# Patient Record
Sex: Female | Born: 2000 | Race: White
Health system: Southern US, Community
[De-identification: ages and names within clinical notes are randomized; demographics above are authoritative.]

---
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
## 2018-02-19 ENCOUNTER — Other Ambulatory Visit (INDEPENDENT_AMBULATORY_CARE_PROVIDER_SITE_OTHER): Payer: Self-pay | Admitting: Physician Assistant

## 2018-09-12 ENCOUNTER — Other Ambulatory Visit: Payer: Self-pay

## 2018-09-12 ENCOUNTER — Encounter (HOSPITAL_COMMUNITY): Payer: Self-pay

## 2018-09-12 DIAGNOSIS — J111 Influenza due to unidentified influenza virus with other respiratory manifestations: Secondary | ICD-10-CM | POA: Diagnosis not present

## 2018-09-12 DIAGNOSIS — R509 Fever, unspecified: Secondary | ICD-10-CM | POA: Diagnosis present

## 2018-09-12 NOTE — ED Triage Notes (Signed)
Pt BIB EMS from home. Pt reports cold/flu like symptoms since this morning. Pt reports having fever of 100.1 and took tylenol. Pt reports sore throat and cough.

## 2018-09-13 ENCOUNTER — Emergency Department (HOSPITAL_COMMUNITY)
Admission: EM | Admit: 2018-09-13 | Discharge: 2018-09-13 | Disposition: A | Discharge status: Home / Self Care | Payer: Federal, State, Local not specified - PPO | Attending: Emergency Medicine | Admitting: Emergency Medicine

## 2018-09-13 ENCOUNTER — Emergency Department (HOSPITAL_COMMUNITY): Payer: Federal, State, Local not specified - PPO

## 2018-09-13 DIAGNOSIS — R69 Illness, unspecified: Secondary | ICD-10-CM

## 2018-09-13 DIAGNOSIS — J111 Influenza due to unidentified influenza virus with other respiratory manifestations: Secondary | ICD-10-CM

## 2018-09-13 LAB — URINALYSIS, ROUTINE W REFLEX MICROSCOPIC
BILIRUBIN URINE: NEGATIVE
GLUCOSE, UA: NEGATIVE mg/dL
HGB URINE DIPSTICK: NEGATIVE
Ketones, ur: 80 mg/dL — AB
Leukocytes, UA: NEGATIVE
Nitrite: NEGATIVE
Protein, ur: NEGATIVE mg/dL
Specific Gravity, Urine: 1.032 — ABNORMAL HIGH (ref 1.005–1.030)
pH: 5 (ref 5.0–8.0)

## 2018-09-13 LAB — PREGNANCY, URINE: Preg Test, Ur: NEGATIVE

## 2018-09-13 LAB — GROUP A STREP BY PCR: Group A Strep by PCR: NOT DETECTED

## 2018-09-13 LAB — I-STAT CHEM 8, ED
BUN: 11 mg/dL (ref 6–20)
Calcium, Ion: 1.2 mmol/L (ref 1.15–1.40)
Chloride: 104 mmol/L (ref 98–111)
Creatinine, Ser: 0.7 mg/dL (ref 0.44–1.00)
Glucose, Bld: 108 mg/dL — ABNORMAL HIGH (ref 70–99)
HCT: 34 % — ABNORMAL LOW (ref 36.0–46.0)
HEMOGLOBIN: 11.6 g/dL — AB (ref 12.0–15.0)
Potassium: 3.7 mmol/L (ref 3.5–5.1)
Sodium: 136 mmol/L (ref 135–145)
TCO2: 22 mmol/L (ref 22–32)

## 2018-09-13 MED ORDER — SODIUM CHLORIDE 0.9 % IV BOLUS
1000.0000 mL | Freq: Once | INTRAVENOUS | Status: AC
  Administered 2018-09-13: 1000 mL via INTRAVENOUS

## 2018-09-13 MED ORDER — IBUPROFEN 800 MG PO TABS
800.0000 mg | ORAL_TABLET | Freq: Once | ORAL | Status: AC
  Administered 2018-09-13: 800 mg via ORAL
  Filled 2018-09-13: qty 1

## 2018-09-13 MED ORDER — ONDANSETRON HCL 4 MG/2ML IJ SOLN
4.0000 mg | Freq: Once | INTRAMUSCULAR | Status: AC
  Administered 2018-09-13: 4 mg via INTRAVENOUS
  Filled 2018-09-13: qty 2

## 2018-09-13 MED ORDER — OSELTAMIVIR PHOSPHATE 75 MG PO CAPS: 75.0000 mg | ORAL_CAPSULE | Freq: Two times a day (BID) | ORAL | 0 refills | Status: AC

## 2018-09-13 NOTE — ED Provider Notes (Signed)
Emergency Department Provider Note   I have reviewed the triage vital signs and the nursing notes.   HISTORY  Chief Complaint Flu Like Symptoms   HPI Carol Flynn is a 18 y.o. female who is previously healthy and did not get a flu shot this year the presents the emergency department today with a fever.  Patient states she started with a sore throat earlier today and started having a fever as high as 102 and body aches throughout the day.  Has a bit of nausea but no vomiting.  Mild abdominal pain associate with the nausea.  No urinary symptoms.  Decreased appetite and intake.  Try taking 1 Tylenol prior to arrival which did not seem to help so she came here for further evaluation.  Has had a dry cough but nothing productive.  Last menstrual cycle at the beginning of January. No other associated or modifying symptoms.    History reviewed. No pertinent past medical history.  There are no active problems to display for this patient.   History reviewed. No pertinent surgical history.  Current Outpatient Rx  . Order #: [id number] Class: Historical Med  . Order #: [id number] Class: Print    Allergies Amoxicillin and Penicillins  History reviewed. No pertinent family history.  Social History Social History   Tobacco Use  . Smoking status: Not on file  Substance Use Topics  . Alcohol use: Not on file  . Drug use: Not on file    Review of Systems  All other systems negative except as documented in the HPI. All pertinent positives and negatives as reviewed in the HPI. ____________________________________________   PHYSICAL EXAM:  VITAL SIGNS: ED Triage Vitals  Enc Vitals Group     BP 09/12/18 2215 104/65     Pulse Rate 09/12/18 2215 (!) 130     Resp 09/12/18 2215 16     Temp 09/12/18 2215 99 F (37.2 C)     Temp Source 09/12/18 2215 Oral     SpO2 09/12/18 2215 98 %     Weight 09/12/18 2215 105 lb (47.6 kg)     Height 09/12/18 2215 5\' 7"  (1.702 m)     Constitutional: Alert and oriented. Well appearing and in no acute distress. Eyes: Conjunctivae are normal. PERRL. EOMI. Head: Atraumatic. Nose: No congestion/rhinnorhea. Mouth/Throat: Mucous membranes are moist.  Oropharynx non-erythematous. Neck: No stridor.  No meningeal signs.  Lymphadenopathy Cardiovascular: tacchycardic rate, regular rhythm. Good peripheral circulation. Grossly normal heart sounds.   Respiratory: tachypneic respiratory effort.  No retractions. Lungs CTAB. Gastrointestinal: Soft and nontender. No distention.  Musculoskeletal: No lower extremity tenderness nor edema. No gross deformities of extremities. Neurologic:  Normal speech and language. No gross focal neurologic deficits are appreciated.  Skin:  Skin is warm, dry and intact. No rash noted.   ____________________________________________   LABS (all labs ordered are listed, but only abnormal results are displayed)  Labs Reviewed  URINALYSIS, ROUTINE W REFLEX MICROSCOPIC - Abnormal; Notable for the following components:      Result Value   APPearance HAZY (*)    Specific Gravity, Urine 1.032 (*)    Ketones, ur 80 (*)    All other components within normal limits  I-STAT CHEM 8, ED - Abnormal; Notable for the following components:   Glucose, Bld 108 (*)    Hemoglobin 11.6 (*)    HCT 34.0 (*)    All other components within normal limits  GROUP A STREP BY PCR  PREGNANCY, URINE  POC URINE PREG,  ED   ____________________________________________  EKG   EKG Interpretation  Date/Time:    Ventricular Rate:    PR Interval:    QRS Duration:   QT Interval:    QTC Calculation:   R Axis:     Text Interpretation:         ____________________________________________  RADIOLOGY  Dg Chest 2 View  Result Date: 09/13/2018 CLINICAL DATA:  Cough and fever EXAM: CHEST - 2 VIEW COMPARISON:  None. FINDINGS: The heart size and mediastinal contours are within normal limits. Both lungs are clear. The  visualized skeletal structures are unremarkable. IMPRESSION: No active cardiopulmonary disease. Electronically Signed   By: Deatra RobinsonKevin  Herman M.D.   On: 09/13/2018 03:35    ____________________________________________   INITIAL IMPRESSION / ASSESSMENT AND PLAN / ED COURSE  Strep versus influenza versus other viral infection however she is slightly hypoxic and tachypneic and tachycardic which are probably all related to the fever and the viral infection but has a history of pneumonia so we will get a chest x-ray, labs give her some fluids and anti-paramedics and reevaluate for disposition.  Strep test as well.  Workup unremarkable. Will treat for influenza. Tolerating PO. HR/symptoms improved significantly with defeverscence.   Pertinent labs & imaging results that were available during my care of the patient were reviewed by me and considered in my medical decision making (see chart for details).  ____________________________________________  FINAL CLINICAL IMPRESSION(S) / ED DIAGNOSES  Final diagnoses:  Influenza-like illness     MEDICATIONS GIVEN DURING THIS VISIT:  Medications  ibuprofen (ADVIL,MOTRIN) tablet 800 mg (800 mg Oral Given 09/13/18 0410)  sodium chloride 0.9 % bolus 1,000 mL (0 mLs Intravenous Stopped 09/13/18 0539)  ondansetron (ZOFRAN) injection 4 mg (4 mg Intravenous Given 09/13/18 0411)     NEW OUTPATIENT MEDICATIONS STARTED DURING THIS VISIT:  New Prescriptions   OSELTAMIVIR (TAMIFLU) 75 MG CAPSULE    Take 1 capsule (75 mg total) by mouth every 12 (twelve) hours.    Note:  This note was prepared with assistance of Dragon voice recognition software. Occasional wrong-word or sound-a-like substitutions may have occurred due to the inherent limitations of voice recognition software.   Lorey Pallett, Barbara CowerJason, MD 09/13/18 [id number]

## 2019-03-06 ENCOUNTER — Encounter (INDEPENDENT_AMBULATORY_CARE_PROVIDER_SITE_OTHER): Payer: Self-pay

## 2019-03-27 ENCOUNTER — Encounter (INDEPENDENT_AMBULATORY_CARE_PROVIDER_SITE_OTHER): Payer: Self-pay

## 2019-03-31 ENCOUNTER — Encounter (INDEPENDENT_AMBULATORY_CARE_PROVIDER_SITE_OTHER): Payer: BLUE CROSS/BLUE SHIELD | Admitting: Physician Assistant

## 2019-04-01 LAB — COMPREHENSIVE METABOLIC PANEL
ALT: 13 IU/L (ref 0–32)
AST (SGOT): 16 IU/L (ref 0–40)
Albumin/Globulin Ratio: 1.8 (ref 1.2–2.2)
Albumin: 5 g/dL (ref 3.5–5.5)
Alkaline Phosphatase: 73 IU/L (ref 43–101)
BUN / Creatinine Ratio: 17 (ref 9–23)
BUN: 13 mg/dL (ref 6–20)
Bilirubin, Total: 0.7 mg/dL (ref 0.0–1.2)
CO2: 20 mmol/L (ref 20–29)
Calcium: 10.1 mg/dL (ref 8.7–10.2)
Chloride: 103 mmol/L (ref 96–106)
Creatinine: 0.76 mg/dL (ref 0.57–1.00)
EGFR: 115 mL/min/{1.73_m2} (ref >59)
EGFR: 132 mL/min/{1.73_m2} (ref >59)
Globulin, Total: 2.8 g/dL (ref 1.5–4.5)
Glucose: 90 mg/dL (ref 65–99)
Potassium: 4.4 mmol/L (ref 3.5–5.2)
Protein, Total: 7.8 g/dL (ref 6.0–8.5)
Sodium: 138 mmol/L (ref 134–144)

## 2019-04-01 LAB — CBC AND DIFFERENTIAL
Baso(Absolute): 0 10*3/uL (ref 0.0–0.2)
Basos: 0 %
Eos: 0 %
Eosinophils Absolute: 0 10*3/uL (ref 0.0–0.4)
Hematocrit: 40.7 % (ref 34.0–46.6)
Hemoglobin: 12.8 g/dL (ref 11.1–15.9)
Immature Granulocytes Absolute: 0 10*3/uL (ref 0.0–0.1)
Immature Granulocytes: 0 %
Lymphocytes Absolute: 3 10*3/uL (ref 0.7–3.1)
Lymphocytes: 15 %
MCH: 29.2 pg (ref 26.6–33.0)
MCHC: 31.4 g/dL — ABNORMAL LOW (ref 31.5–35.7)
MCV: 93 fL (ref 79–97)
Monocytes Absolute: 0.6 10*3/uL (ref 0.1–0.9)
Monocytes: 3 %
Neutrophils Absolute: 15.6 10*3/uL — ABNORMAL HIGH (ref 1.4–7.0)
Neutrophils: 82 %
Platelets: 328 10*3/uL (ref 150–450)
RBC: 4.39 x10E6/uL (ref 3.77–5.28)
RDW: 12.5 % (ref 12.3–15.4)
WBC: 19.3 10*3/uL — ABNORMAL HIGH (ref 3.4–10.8)

## 2019-04-01 LAB — LIPID PANEL, WITHOUT TOTAL CHOLESTEROL/HDL RATIO, SERUM
Cholesterol: 179 mg/dL — ABNORMAL HIGH (ref 100–169)
HDL: 75 mg/dL (ref >39)
LDL Calculated: 90 mg/dL (ref 0–109)
Triglycerides: 69 mg/dL (ref 0–89)
VLDL Calculated: 14 mg/dL (ref 5–40)

## 2019-04-01 LAB — TSH: TSH: 1.09 u[IU]/mL (ref 0.450–4.500)

## 2019-04-03 ENCOUNTER — Ambulatory Visit (INDEPENDENT_AMBULATORY_CARE_PROVIDER_SITE_OTHER): Payer: BLUE CROSS/BLUE SHIELD | Admitting: Family

## 2019-04-03 ENCOUNTER — Encounter (INDEPENDENT_AMBULATORY_CARE_PROVIDER_SITE_OTHER): Payer: Self-pay | Admitting: Family

## 2019-04-03 VITALS — BP 100/62 | HR 84 | Temp 99.3°F | Resp 16 | Ht 67.0 in | Wt 101.4 lb

## 2019-04-03 DIAGNOSIS — Z0000 Encounter for general adult medical examination without abnormal findings: Secondary | ICD-10-CM

## 2019-04-03 DIAGNOSIS — Z23 Encounter for immunization: Secondary | ICD-10-CM

## 2019-04-03 DIAGNOSIS — Z3041 Encounter for surveillance of contraceptive pills: Secondary | ICD-10-CM

## 2019-04-03 MED ORDER — LEVONORGEST-ETH ESTRAD 91-DAY 0.15-0.03 &0.01 MG PO TABS
1.0000 | ORAL_TABLET | Freq: Every day | ORAL | 3 refills | Status: DC
Start: 2019-04-03 — End: 2020-03-14

## 2019-04-03 NOTE — Progress Notes (Signed)
[facility] [place] [facility]                       Date of Exam: 04/03/2019 4:14 PM        Patient ID: Virginia Navarro is a 18 y.o. female.  Attending Physician: Levy Sjogren, NP        Chief Complaint:    Chief Complaint   Patient presents with    Annual Exam     not fasting               HPI:    Pt still gets sick to stomach when she has a menstrual cycle but less so than before starting birth control. Happens about every 3rd month.  Thinks she has an ingrowing toenail Lt Great toe. Painful when wearing narrow shoes.       Visit Type: Health Maintenance Visit    Reported Health: good health  Reported Diet: eats healthy but could do better.  Reported Exercise: 3-4x/week, <15 minutes and walking    Dental: regular dental visits twice a year  Vision: contact lenses  Hearing: normal hearing    Immunization Status: Needs Bexsero #2    Reproductive Health: not currently sexually active  Contraception: oral contraceptives.    PHQ 2: 0    Safety Elements Used: uses seat belts, smoke detectors in household, carbon monoxide detectors in household, sunscreen use,   and no guns at home. Pt does not drive .            Problem List:    There is no problem list on file for this patient.            Current Meds:    Outpatient Medications Marked as Taking for the 04/03/19 encounter (Office Visit) with Levy Sjogren, NP   Medication Sig Dispense Refill    Sodium Fluoride (Sodium Fluoride 5000 PPM) 1.1 % Paste sodium fluoride 1.1 % dental paste      [DISCONTINUED] Norgestim-Eth Estrad Triphasic 0.18/0.215/0.25 MG-25 MCG Tab Tri-Lo-Sprintec 0.18 mg/0.215 mg/0.25 mg-25 mcg tablet            Allergies:    Allergies   Allergen Reactions    Amoxicillin Hives    Penicillins Hives             Past Surgical History:    History reviewed. No pertinent surgical history.        Family History:    History reviewed. No pertinent family history.        Social History:    Social History     Tobacco Use     Smoking status: Never Smoker    Smokeless tobacco: Never Used   Substance Use Topics    Alcohol use: Never     Frequency: Never    Drug use: Never          The following sections were reviewed this encounter by the provider:   Tobacco   Allergies   Meds   Problems   Med Hx   Surg Hx   Fam Hx              Vital Signs:    BP 100/62 (BP Site: Right arm, Patient Position: Sitting, Cuff Size: Large)    Pulse 84    Temp 99.3 F (37.4 C) (Tympanic)    Resp 16    Ht 1.702 m (5\' 7" )    Wt 46 kg (101 lb 6.4 oz)  LMP 03/11/2019 (Exact Date)    BMI 15.88 kg/m          ROS:    Review of Systems   Constitutional: Negative for fatigue and unexpected weight change.   HENT: Negative for congestion and sinus pain.    Eyes: Negative for itching and visual disturbance.   Respiratory: Negative for cough and shortness of breath.    Cardiovascular: Negative for chest pain and palpitations.   Gastrointestinal: Negative for constipation, diarrhea, nausea and vomiting.   Endocrine: Negative for cold intolerance and heat intolerance.   Genitourinary: Negative for difficulty urinating and menstrual problem.   Musculoskeletal: Negative for arthralgias and myalgias.   Skin: Negative for color change and rash.   Neurological: Negative for light-headedness and headaches.   Hematological: Does not bruise/bleed easily.   Psychiatric/Behavioral: Negative for sleep disturbance. The patient is not nervous/anxious.               Physical Exam:    Physical Exam  Constitutional:       General: She is not in acute distress.     Appearance: Normal appearance.   HENT:      Head: Normocephalic and atraumatic.      Right Ear: Tympanic membrane, ear canal and external ear normal.      Left Ear: Tympanic membrane, ear canal and external ear normal.      Nose: Nose normal.      Mouth/Throat:      Mouth: Mucous membranes are moist.      Pharynx: Oropharynx is clear.   Eyes:      Extraocular Movements: Extraocular movements intact.      Conjunctiva/sclera:  Conjunctivae normal.      Pupils: Pupils are equal, round, and reactive to light.   Neck:      Musculoskeletal: Normal range of motion and neck supple.   Cardiovascular:      Rate and Rhythm: Normal rate and regular rhythm.      Heart sounds: No murmur. No friction rub. No gallop.    Pulmonary:      Effort: Pulmonary effort is normal. No respiratory distress.      Breath sounds: Normal breath sounds. No wheezing.   Abdominal:      General: Abdomen is flat. Bowel sounds are normal.      Palpations: Abdomen is soft. There is no mass.      Tenderness: There is no abdominal tenderness. There is no guarding or rebound.   Musculoskeletal: Normal range of motion.      Right lower leg: No edema.      Left lower leg: No edema.   Lymphadenopathy:      Cervical: No cervical adenopathy.      Upper Body:      Right upper body: No supraclavicular adenopathy.      Left upper body: No supraclavicular adenopathy.   Skin:     General: Skin is warm and dry.      Comments: Lt Great toe slightly tender to touch along medial nailbed. No erythema or edema.   Neurological:      General: No focal deficit present.      Mental Status: She is alert and oriented to person, place, and time.      Cranial Nerves: No cranial nerve deficit.   Psychiatric:         Mood and Affect: Mood normal.         Behavior: Behavior normal.  Assessment:    1. Well adult exam  - Meningococcal group B recomb 2 DOSE    2. Immunization due  - Meningococcal group B recomb 2 DOSE    3. Encounter for surveillance of contraceptive pills  - levonorgestrel-ethinyl estradiol (SEASONIQUE) 0.15-0.03 &0.01 MG per tablet; Take 1 tablet by mouth daily  Dispense: 84 tablet; Refill: 3            Plan:    Health Maintenance    Lifestyle discussed: Healthy diet rich in fruits and vegetables, whole grains and lean meats, scarce in sugars, carbs, processed foods.   Exercise 30 minutes at least five days a week. (total of 150 minutes per week).    Wear Sunscreen in the sun,  helmets on every bike ride and seat belts for every car ride.   Changing OCP to seasonal to lessen symptoms.  Gave list of Podiatrists to see if Great toe pain worsens.        Follow-up:    Return in about 1 year (around 04/02/2020) for wellness.         Levy Sjogren, NP

## 2019-04-21 ENCOUNTER — Encounter (INDEPENDENT_AMBULATORY_CARE_PROVIDER_SITE_OTHER): Payer: Self-pay

## 2019-10-29 ENCOUNTER — Encounter (INDEPENDENT_AMBULATORY_CARE_PROVIDER_SITE_OTHER): Payer: Self-pay

## 2019-11-01 ENCOUNTER — Ambulatory Visit (INDEPENDENT_AMBULATORY_CARE_PROVIDER_SITE_OTHER): Payer: BLUE CROSS/BLUE SHIELD

## 2019-11-01 DIAGNOSIS — Z23 Encounter for immunization: Secondary | ICD-10-CM

## 2020-03-10 ENCOUNTER — Emergency Department
Admission: EM | Admit: 2020-03-10 | Discharge: 2020-03-10 | Disposition: A | Discharge status: Home / Self Care | Payer: BLUE CROSS/BLUE SHIELD | Attending: Pediatrics | Admitting: Pediatrics

## 2020-03-10 ENCOUNTER — Emergency Department: Payer: BLUE CROSS/BLUE SHIELD

## 2020-03-10 DIAGNOSIS — R42 Dizziness and giddiness: Secondary | ICD-10-CM | POA: Insufficient documentation

## 2020-03-10 DIAGNOSIS — W273XXA Contact with needle (sewing), initial encounter: Secondary | ICD-10-CM

## 2020-03-10 DIAGNOSIS — W228XXA Striking against or struck by other objects, initial encounter: Secondary | ICD-10-CM | POA: Insufficient documentation

## 2020-03-10 DIAGNOSIS — Y9389 Activity, other specified: Secondary | ICD-10-CM | POA: Insufficient documentation

## 2020-03-10 DIAGNOSIS — W458XXA Other foreign body or object entering through skin, initial encounter: Secondary | ICD-10-CM | POA: Insufficient documentation

## 2020-03-10 DIAGNOSIS — S61042A Puncture wound with foreign body of left thumb without damage to nail, initial encounter: Secondary | ICD-10-CM | POA: Insufficient documentation

## 2020-03-10 MED ORDER — ACETAMINOPHEN 325 MG PO TABS
650.0000 mg | ORAL_TABLET | Freq: Once | ORAL | Status: AC
Start: 2020-03-10 — End: 2020-03-10
  Administered 2020-03-10: 18:00:00 650 mg via ORAL
  Filled 2020-03-10: qty 2

## 2020-03-10 NOTE — Discharge Instructions (Signed)
Wound Infection    A small number of wounds can get infected. This can happen even if they are treated and cleaned well. Infection symptoms are increasing pain, pus, redness or warmth. Drainage from the wound site is another symptom. The wound edges may also come apart. Wound infections are treated with antibiotics. Treatment also includes cleaning.    YOU SHOULD SEEK MEDICAL ATTENTION IMMEDIATELY, EITHER HERE OR AT THE NEAREST EMERGENCY DEPARTMENT, IF ANY OF THE FOLLOWING OCCURS:   Symptoms keep getting worse despite treatment.   The infection size increases and redness spreads.   You develop fevers (temperature higher than 100.85F / 38C).   You feel weak or lightheaded.   Red streaks spread from the infection site.   Any other new symptoms or concerns.

## 2020-03-10 NOTE — ED Notes (Signed)
Pt awake and alert. No bleeding or drainage from thumb. NAD

## 2020-03-10 NOTE — ED Notes (Signed)
Bed: OZ18-P  Expected date:   Expected time:   Means of arrival:   Comments:  Medic 430

## 2020-03-10 NOTE — ED Provider Notes (Signed)
[facility] PEDIATRIC EMERGENCY DEPARTMENT H&P                                             ATTENDING SUPERVISORY NOTE      Visit date: 03/10/2020      CLINICAL SUMMARY          Diagnosis:    .     Final diagnoses:   Contact with sewing needle, initial encounter         MDM Notes:    19 y.o. female with sewing needle embedded in dorsum of left thumb.  Removed  No active bleeding  Xray neg for fracture.  rec localized wound care, bacitracin and bandaid for home.               Disposition:         Discharge         Discharge Prescriptions     None                      CLINICAL INFORMATION        HPI:        Chief Complaint: Finger Injury  .    Virginia Navarro is a 19 y.o. female who presents with finger injury. She was sewing when she accidentally got the needle stuck in the middle of the nail of her L thumb. She jerked her hand back and the needle broke off in her thumb through the nail. She felt a little faint so she laid down. No LOC. Came to the ED for further evaluation.     History obtained from: patient      ROS:      Positive and negative ROS elements as per HPI.  All other systems reviewed and negative.      Physical Exam:      Pulse 88   BP 111/66   Resp 20   SpO2 99 %   Temp 97.9 F (36.6 C)   Wt 47 kg    Alert, anxious adult female  Right thumb with 1 cm sewing needle embedded in the nail and extending vertically out of the finger.  No other injuries noted.            PAST HISTORY        Primary Care Provider: Jimmy Picket, PA        PMH/PSH:    .     History reviewed. No pertinent past medical history.    She has no past surgical history on file.      Social/Family History:      Pediatric History   Patient Parents    Wisnieski, scott (Father)     Other Topics Concern    Not on file   Social History Narrative    Not on file     Social History     Tobacco Use    Smoking status: Never Smoker    Smokeless tobacco: Never Used   Substance Use Topics    Alcohol use: Never      Additional Social History: Lives with parents    History reviewed. No pertinent family history.      Listed Medications on Arrival:    .     Home Medications             levonorgestrel-ethinyl estradiol (SEASONIQUE) 0.15-0.03 &  0.01 MG per tablet     Take 1 tablet by mouth daily     Sodium Fluoride (Sodium Fluoride 5000 PPM) 1.1 % Paste     sodium fluoride 1.1 % dental paste          Allergies: She is allergic to amoxicillin and penicillins.            VISIT INFORMATION        Clinical Course in the ED:                   Medications Given in the ED:    .     ED Medication Orders (From admission, onward)    Start Ordered     Status Ordering Provider    03/10/20 1747 03/10/20 1746  acetaminophen (TYLENOL) tablet 650 mg  Once     Route: Oral  Ordered Dose: 650 mg     Last MAR action: Given Reathel Turi            Procedures:      Procedures      Interpretations:      Radiology -             interpreted by me with the following observations: no fracture, no residual FB.                 RESULTS        Lab Results:      Results     ** No results found for the last 24 hours. **              Radiology Results:      Finger(s) Left Minimum 2 View   Final Result       No fracture or other metallic foreign body identified.      Prince Solian, MD    03/10/2020 6:45 PM                  Supervisory Statements:      I have reviewed and agree with the history except as noted above. The pertinent physical exam has been documented.  I have reviewed and agree with the final ED diagnosis.      Scribe Attestation:      I was acting as a Neurosurgeon for Mechele Collin, MD on [place],[address]    I am the first provider for this patient and I personally performed the services documented. Cristal [address] is scribing for me on [place],Kindra ELIZABETH. This note accurately reflects work and decisions made by me.  Mechele Collin, MD                                    Mechele Collin, MD  03/12/20  1304

## 2020-03-10 NOTE — ED Provider Notes (Signed)
[facility] PEDIATRIC EMERGENCY DEPARTMENT RESIDENT H&P       CLINICAL INFORMATION        HPI:        Chief Complaint: Finger Injury  .    Virginia Navarro is a 19 y.o. female who presents with sewing needle stuck in left thumb. Reports she was using sewing machine and needle penetrated left thumb through the middle of the nail. Patient jerked hand back and needle broke off. Reports that she initially felt lightheaded and had to lie down. Reports pain is 9 or 10 out of 10 over area. Denies syncope or LOC. Immunizations UTD.     History obtained from: patient, parent      ROS:      Review of Systems   Constitutional: Negative for chills and fever.   Gastrointestinal: Negative for nausea and vomiting.   Neurological: Positive for light-headedness. Negative for syncope.         Physical Exam:      Pulse 88   BP 111/66   Resp 20   SpO2 99 %   Temp 97.9 F (36.6 C)   Wt 47 kg    Physical Exam  Constitutional:       Appearance: Normal appearance.   Skin:     General: Skin is warm and dry.      Capillary Refill: Capillary refill takes less than 2 seconds.      Comments: 0.5cm needle in left thumb penetrating nail; left thumb neurovascularly intact   Neurological:      Mental Status: She is alert.                 PAST HISTORY        Primary Care Provider: Jimmy Picket, PA        PMH/PSH:    .     History reviewed. No pertinent past medical history.    She has no past surgical history on file.      Social/Family History:      Pediatric History   Patient Parents    Yepez, scott (Father)     Other Topics Concern    Not on file   Social History Narrative    Not on file     Social History     Tobacco Use    Smoking status: Never Smoker    Smokeless tobacco: Never Used   Substance Use Topics    Alcohol use: Never     Additional Social History: Lives with parents    History reviewed. No pertinent family history.      Listed Medications on Arrival:    .     Home Medications              levonorgestrel-ethinyl estradiol (SEASONIQUE) 0.15-0.03 &0.01 MG per tablet     Take 1 tablet by mouth daily     Sodium Fluoride (Sodium Fluoride 5000 PPM) 1.1 % Paste     sodium fluoride 1.1 % dental paste         Allergies: She is allergic to amoxicillin and penicillins.            VISIT INFORMATION        Reassessments/Clinical Course:    Sewing needle removed from left thumb. Xray finger shows no evidence of metallic foreign body or fractures.         Conversations with Other Providers:              Medications Given  in the ED:    .     ED Medication Orders (From admission, onward)    Start Ordered     Status Ordering Provider    03/10/20 1747 03/10/20 1746  acetaminophen (TYLENOL) tablet 650 mg  Once     Route: Oral  Ordered Dose: 650 mg     Last MAR action: Given Mechele Collin            Procedures:      Foreign Body    Date/Time: 03/10/2020 8:07 PM  Performed by: Mechele Collin, MD  Authorized by: Mechele Collin, MD   Intake: nail of left thumb.    Sedation:  Patient sedated: no    Patient cooperative: yes  Complexity: simple  Objects recovered: tip of sewming needle about 0.5cm in length  Post-procedure assessment: foreign body removed  Patient tolerance: patient tolerated the procedure well with no immediate complications  Comments: Bacitracin applied over area          Assessment/Plan:    [name] is a 19 y.o. female who presents with sewing needle stuck in left thumb. Needle removed and xray finger shows no evidence of metallic foreign body or fractures. Discharged home with instructions to watch for  infection and return to ED if infection noted.             Carlyon Shadow, MD  Resident  03/10/20 2010       Carlyon Shadow, MD  Resident  03/10/20 2012

## 2020-03-10 NOTE — ED Triage Notes (Signed)
Pt was sewing and got needle stick in nail of left thumb. No bleeding. Thought that she was going to pass out so layed down. No actually syncope. Awake and alert. Doesn't want to look at it.

## 2020-03-14 ENCOUNTER — Encounter (INDEPENDENT_AMBULATORY_CARE_PROVIDER_SITE_OTHER): Payer: Self-pay

## 2020-03-14 ENCOUNTER — Ambulatory Visit (INDEPENDENT_AMBULATORY_CARE_PROVIDER_SITE_OTHER): Payer: BLUE CROSS/BLUE SHIELD | Admitting: Family

## 2020-03-14 ENCOUNTER — Encounter (INDEPENDENT_AMBULATORY_CARE_PROVIDER_SITE_OTHER): Payer: Self-pay | Admitting: Family

## 2020-03-14 VITALS — BP 120/80 | HR 80 | Temp 98.7°F | Resp 18 | Ht 67.0 in | Wt 104.3 lb

## 2020-03-14 DIAGNOSIS — N911 Secondary amenorrhea: Secondary | ICD-10-CM

## 2020-03-14 DIAGNOSIS — S61349D Puncture wound with foreign body of unspecified finger with damage to nail, subsequent encounter: Secondary | ICD-10-CM

## 2020-03-14 LAB — POCT HCG, URINE, BY VISUAL COLOR

## 2020-03-14 NOTE — Progress Notes (Signed)
Subjective:      Patient ID: Virginia Navarro is a 19 y.o. female     Chief Complaint   Patient presents with    Amenorrhea     x 1week        Pt reports missing period x 1week.  Pt wants to make sure she is not pregnant.  Pt also went to ER on 03/10/20 also stabbed herself with a needle on L thumb.  She would like the provider to check how it is healing. Sewing machine needle went through her nail but did not penetrate bone.    LMP: 01/30/2020  Switched from [place] to [place] 1/6mcg 2 months ago. Has only missed one pill.  No nausea, ab pain, fatigue.    Amenorrhea  The patient's primary symptoms include missed menses. Pertinent negatives include no abdominal pain, anorexia, back pain, chills, constipation, diarrhea, discolored urine, dysuria, fever, flank pain, frequency, headaches, hematuria, joint pain, joint swelling, nausea, painful intercourse, rash, sore throat, urgency or vomiting.        The following sections were reviewed this encounter by the provider:        Review of Systems   Constitutional: Negative for chills and fever.   HENT: Negative for sore throat.    Gastrointestinal: Negative for abdominal pain, anorexia, constipation, diarrhea, nausea and vomiting.   Genitourinary: Positive for missed menses. Negative for dysuria, flank pain, frequency, hematuria and urgency.   Musculoskeletal: Negative for back pain and joint pain.   Skin: Negative for rash.   Neurological: Negative for headaches.          BP 120/80 (BP Site: Right arm, Patient Position: Sitting, Cuff Size: Medium)    Pulse 80    Temp 98.7 F (37.1 C) (Tympanic)    Resp 18    Ht 1.702 m (5\' 7" )    Wt 47.3 kg (104 lb 4.4 oz)    BMI 16.33 kg/m     Objective:     Physical Exam  Constitutional:       Appearance: Normal appearance. She is underweight.   Pulmonary:      Effort: Pulmonary effort is normal.   Skin:     General: Skin is warm and dry.      Comments: Small puncture in Left thumb nail. No surrounding erythema. No  discharge.   Neurological:      General: No focal deficit present.      Mental Status: She is alert and oriented to person, place, and time.   Psychiatric:         Mood and Affect: Mood normal.         Behavior: Behavior normal.          Assessment:     1. Secondary amenorrhea  - POCT hCG, urine, by visual color    2. Puncture wound of finger with foreign body with damage to nail, subsequent encounter        Plan:     Urine pregnancy test negative.  Likely due to adjusting to new OCP.  Keep nail clean with soap and water.  Follow up if any discharge or redness.    Levy Sjogren, NP

## 2020-09-13 IMAGING — CR DG CHEST 2V
2 series · 2 of 2 positions shown · non-contrast
Comparison: None.

CLINICAL DATA: Cough and fever

EXAM:
CHEST - 2 VIEW

[w chest pa]
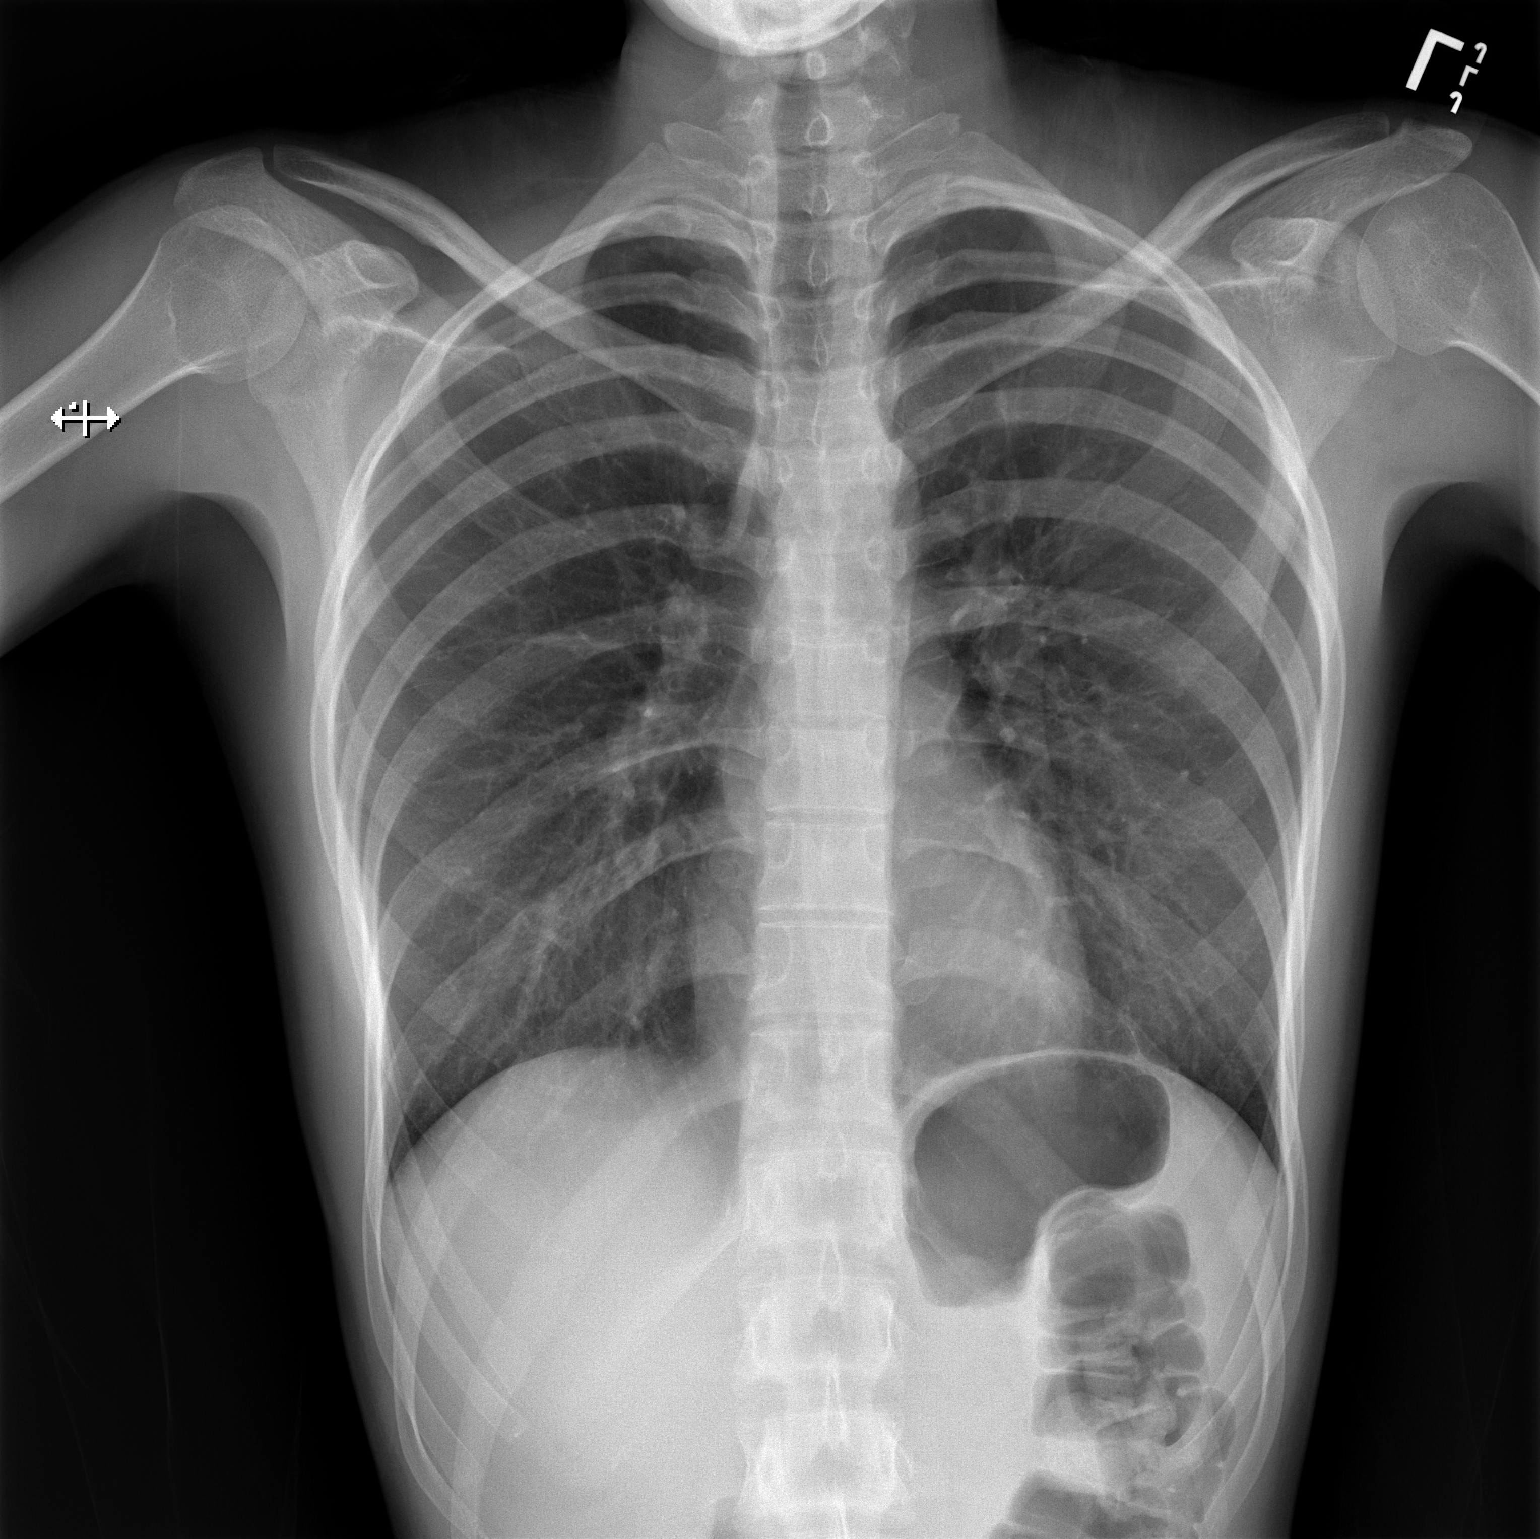

[w chest lat]
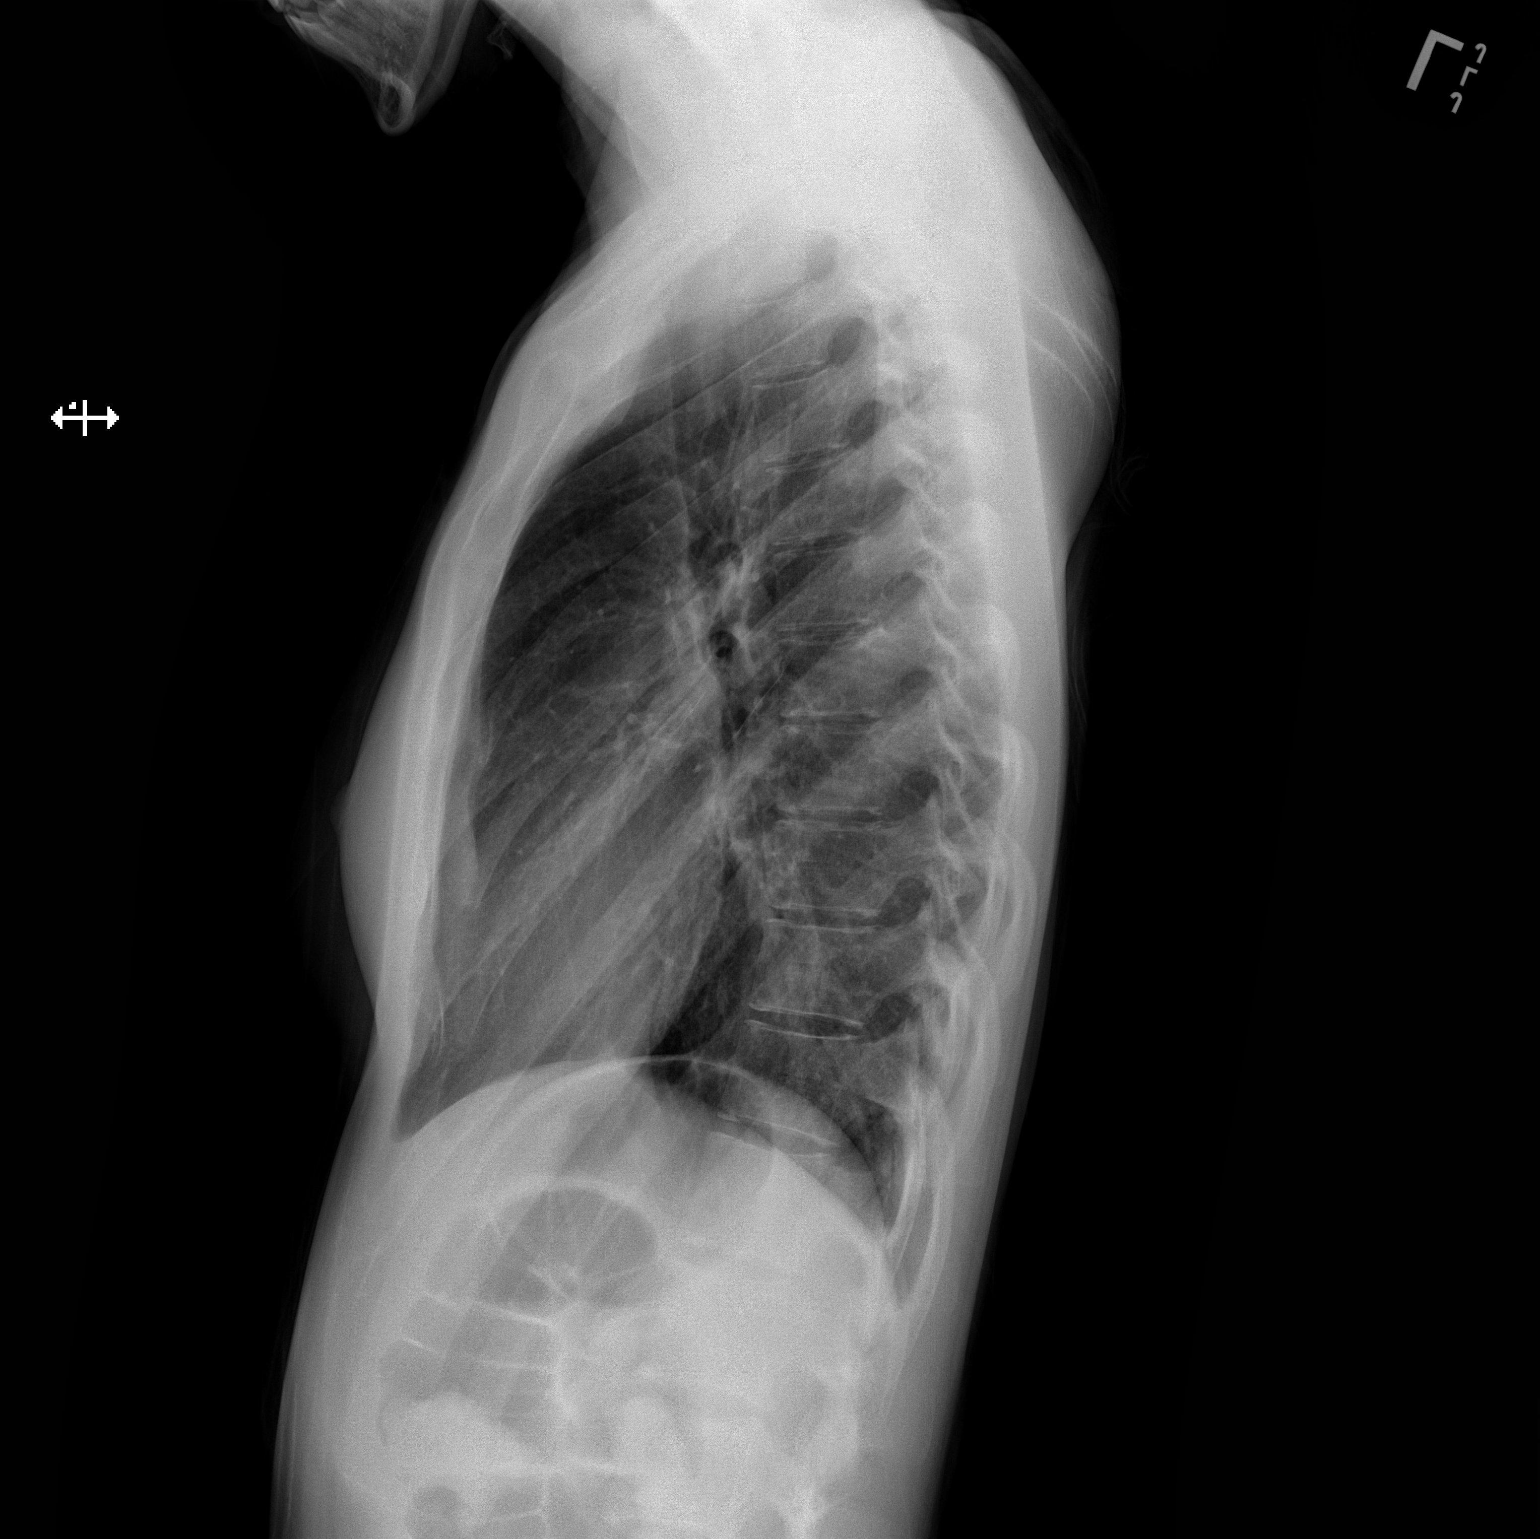

[2 of 2 positions shown; findings below may reference images not displayed]

FINDINGS: The heart size and mediastinal contours are within normal limits.
Both lungs are clear. The visualized skeletal structures are
unremarkable.
IMPRESSION: No active cardiopulmonary disease.

## 2021-01-16 ENCOUNTER — Telehealth (INDEPENDENT_AMBULATORY_CARE_PROVIDER_SITE_OTHER): Payer: Self-pay | Admitting: Family

## 2021-01-16 NOTE — Telephone Encounter (Signed)
Pt is sched for tetanus shot 01/19/21. Pls have orders ready.

## 2021-01-17 NOTE — Telephone Encounter (Signed)
Pt's last tdap 11/28/10. Appropriately scheduled. Orders will be placed at time of visit.

## 2021-01-19 ENCOUNTER — Other Ambulatory Visit: Payer: Self-pay

## 2021-01-19 ENCOUNTER — Ambulatory Visit (INDEPENDENT_AMBULATORY_CARE_PROVIDER_SITE_OTHER): Payer: BLUE CROSS/BLUE SHIELD

## 2021-01-19 DIAGNOSIS — Z23 Encounter for immunization: Secondary | ICD-10-CM

## 2021-01-19 NOTE — Progress Notes (Signed)
Pt needs Tdap for school requirement.  VIS given and reviewed, all questions answered to pt satisfaction.  Pt tolerated injection without immediate side effect and waited x 10 minutes to ensure well being.  Vaccine summary given for pt to provide to school.

## 2021-03-10 ENCOUNTER — Encounter (INDEPENDENT_AMBULATORY_CARE_PROVIDER_SITE_OTHER): Payer: Self-pay

## 2021-04-27 ENCOUNTER — Other Ambulatory Visit (INDEPENDENT_AMBULATORY_CARE_PROVIDER_SITE_OTHER): Payer: Self-pay | Admitting: Family Medicine

## 2021-05-25 HISTORY — PX: DENTAL SURGERY: SHX609

## 2021-06-01 ENCOUNTER — Ambulatory Visit (INDEPENDENT_AMBULATORY_CARE_PROVIDER_SITE_OTHER): Payer: BLUE CROSS/BLUE SHIELD | Admitting: Family

## 2021-06-01 ENCOUNTER — Encounter (INDEPENDENT_AMBULATORY_CARE_PROVIDER_SITE_OTHER): Payer: Self-pay

## 2021-06-01 VITALS — BP 113/76 | HR 91 | Temp 99.0°F | Ht 67.0 in | Wt 105.0 lb

## 2021-06-01 DIAGNOSIS — R1013 Epigastric pain: Secondary | ICD-10-CM

## 2021-06-01 DIAGNOSIS — Z98818 Other dental procedure status: Secondary | ICD-10-CM

## 2021-06-01 DIAGNOSIS — Z3202 Encounter for pregnancy test, result negative: Secondary | ICD-10-CM

## 2021-06-01 DIAGNOSIS — R112 Nausea with vomiting, unspecified: Secondary | ICD-10-CM

## 2021-06-01 LAB — IHS AMB POCT SOFIA (TM) COVID-19 & FLU A/B
Sofia Influenza A Ag POCT: NEGATIVE
Sofia Influenza B Ag POCT: NEGATIVE
Sofia SARS COV2 Antigen POCT: NEGATIVE

## 2021-06-01 LAB — POCT PREGNANCY TEST, URINE HCG: POCT Pregnancy HCG Test, UR: NEGATIVE

## 2021-06-01 LAB — POCT INFECTIOUS MONONUCLEOSIS ANTIBODY: POCT Infectious Mono Heterophile Antibodies: NEGATIVE

## 2021-06-01 LAB — POCT URINALYSIS AUTOMATED (IAH)
Glucose, UA POCT: NEGATIVE
Ketones, UA POCT: 40 mg/dL — AB
Nitrite, UA POCT: NEGATIVE
PH, UA POCT: 5.5 (ref 4.6–8)
Protein, UA POCT: 30 mg/dL — AB
Specific Gravity, UA POCT: >=1.03 mg/dL (ref 1.001–1.035)
Urine Leukocytes POCT: NEGATIVE
Urobilinogen, UA POCT: 1 mg/dL

## 2021-06-01 LAB — POCT GLUCOSE: Whole Blood Glucose POCT: 81 mg/dL (ref 70–100)

## 2021-06-01 LAB — POCT RAPID STREP A: Rapid Strep A Screen POCT: NEGATIVE

## 2021-06-01 MED ORDER — SODIUM CHLORIDE 0.9 % IV BOLUS
1000.0000 mL | Freq: Once | INTRAVENOUS | Status: AC
Start: 2021-06-01 — End: 2021-06-01
  Administered 2021-06-01: 11:00:00 1000 mL via INTRAVENOUS

## 2021-06-01 MED ORDER — ONDANSETRON 4 MG PO TBDP
4.0000 mg | ORAL_TABLET | Freq: Once | ORAL | Status: AC
Start: 2021-06-01 — End: 2021-06-01
  Administered 2021-06-01: 10:00:00 4 mg via ORAL

## 2021-06-01 MED ORDER — FAMOTIDINE 20 MG PO TABS
20.0000 mg | ORAL_TABLET | Freq: Once | ORAL | Status: AC
Start: 2021-06-01 — End: 2021-06-01
  Administered 2021-06-01: 20 mg via ORAL

## 2021-06-01 NOTE — Progress Notes (Signed)
[facility]  PROGRESS NOTE     Patient: Virginia Navarro   Date: 06/01/2021   MRN: [id number]       Virginia Navarro is a 20 y.o. female      HISTORY     History obtained from: Patient    Chief Complaint   Patient presents with    Abdominal Pain     Pt had tooth surgery on Thursday and started to feel pain on her stomach. Pt is  having hard time eating due to pain.           Abdominal Pain   Patient presenting with abdominal pain, recent hx of oral surgery had bone removal to place crown, placed on clindamycin.  Was doing fine eating soft foods without incident for 3 days , Sunday patient began vomiting and called dentist and was instructed to stop clindamycin.  Pt reports since she stopped clindamycin she hasnt vomited but has been febrile and weak.  Decreased po intake.  Reports pain in stomach when swallowing   Review of Systems   Gastrointestinal:  Positive for abdominal pain.   See hpi   History:    Pertinent Past Medical, Surgical, Family and Social History were reviewed.        Current Outpatient Medications:     Estarylla 0.25-35 MG-MCG per tablet, , Disp: , Rfl:     Hailey FE 1/20 1-20 MG-MCG per tablet, , Disp: , Rfl:     Sodium Fluoride (Sodium Fluoride 5000 PPM) 1.1 % Paste, sodium fluoride 1.1 % dental paste (Patient not taking: Reported on 06/01/2021), Disp: , Rfl:   No current facility-administered medications for this visit.    Allergies   Allergen Reactions    Amoxicillin Hives    Penicillins Hives       Medications and Allergies reviewed.    PHYSICAL EXAM     Vitals:    06/01/21 0925   BP: 113/76   Pulse: 91   Temp: 99 F (37.2 C)   TempSrc: Tympanic   SpO2: 96%   Weight: 47.6 kg (105 lb)   Height: 1.702 m (5\' 7" )       Physical Exam  Constitutional:       General: She is not in acute distress.     Appearance: Normal appearance. She is ill-appearing. She is not toxic-appearing.   HENT:      Mouth/Throat:      Mouth: Mucous membranes are moist. No oral lesions.      Pharynx:  Oropharynx is clear. Uvula midline. No pharyngeal swelling, oropharyngeal exudate, posterior oropharyngeal erythema or uvula swelling.      Tonsils: No tonsillar exudate.     Eyes: Pupils are equal, round, and reactive to light. Pulmonary:      Effort: Pulmonary effort is normal. No accessory muscle usage, prolonged expiration or respiratory distress.      Breath sounds: Normal breath sounds and air entry. No decreased breath sounds, wheezing, rhonchi or rales.   Abdominal:      General: Abdomen is flat.       Neurological:      Mental Status: She is alert.       UCC COURSE     LABS  The following POCT tests were ordered, reviewed and discussed with the patient/family.     Results       Procedure Component Value Units Date/Time    Mono Screen [[id number]]  (Normal) Collected: 06/01/21 1009     Updated:  06/01/21 1009     POCT QC Pass     POCT Infectious Mono Heterophile Antibodies Negative    Rapid Group A Strep [[id number]]  (Normal) Collected: 06/01/21 1009    Specimen: Throat Updated: 06/01/21 1009     POCT QC Pass     Rapid Strep A Screen POCT Negative      Comment Negative Results should be confirmed by throat Cx to confirm absence of Strep A inf.    Sofia(TM) SARS COVID19 & Flu A/B POCT [[id number]]  (Normal) Collected: 06/01/21 1005     Updated: 06/01/21 1005     Sofia SARS COV2 Antigen POCT Negative     Sofia Influenza A Ag POCT Negative     Sofia Influenza B Ag POCT Negative    Glucose [[id number]]  (Normal) Collected: 06/01/21 0958     Updated: 06/01/21 0958     Whole Blood Glucose POCT 81 mg/dL             There were no x-rays reviewed with this patient during the visit.    No current facility-administered medications for this visit.       PROCEDURES     Procedures    MEDICAL DECISION MAKING     History, physical, labs/studies most consistent with abdominal pain  as the diagnosis.    Chart Review:  Prior PCP, Specialist and/or ED notes reviewed today: Not Applicable  Prior labs/images/studies reviewed today:  Not Applicable    Differential Diagnosis:       ASSESSMENT     Encounter Diagnoses   Name Primary?    Nausea and vomiting, unspecified vomiting type Yes    Epigastric pain      Dehydration   Abdominal pain   Likely due to vomiting and related to Clindamycin prescribed for oral bone graft   All testing negative   F/u with pcp if s/s not resolving or return   Pt reports improvement of symptoms after 1 liter of fluids        PLAN      PLAN: see above       Orders Placed This Encounter   Procedures    Urine Pregnancy Test,    UA Clinitek (urine dipstick)    Mono Screen    Glucose    Sofia(TM) SARS COVID19 & Flu A/B POCT    Rapid Group A Strep     Requested Prescriptions      No prescriptions requested or ordered in this encounter       Discussed results and diagnosis with patient/family.  Reviewed warning signs for worsening condition, as well as, indications for follow-up with primary care physician and return to urgent care clinic.   Patient/family expressed understanding of instructions.     An After Visit Summary was provided to the patient.

## 2021-06-28 ENCOUNTER — Ambulatory Visit (INDEPENDENT_AMBULATORY_CARE_PROVIDER_SITE_OTHER): Payer: BLUE CROSS/BLUE SHIELD | Admitting: Family

## 2021-06-28 ENCOUNTER — Encounter (INDEPENDENT_AMBULATORY_CARE_PROVIDER_SITE_OTHER): Payer: Self-pay | Admitting: Family

## 2021-06-28 VITALS — BP 106/73 | HR 89 | Temp 98.4°F | Wt 101.6 lb

## 2021-06-28 DIAGNOSIS — K529 Noninfective gastroenteritis and colitis, unspecified: Secondary | ICD-10-CM

## 2021-06-28 LAB — CBC AND DIFFERENTIAL
Absolute NRBC: 0 10*3/uL (ref 0.00–0.00)
Basophils Absolute Automated: 0.07 10*3/uL (ref 0.00–0.08)
Basophils Automated: 0.5 %
Eosinophils Absolute Automated: 0.35 10*3/uL (ref 0.00–0.44)
Eosinophils Automated: 2.6 %
Hematocrit: 37.9 % (ref 34.7–43.7)
Hgb: 12.1 g/dL (ref 11.4–14.8)
Immature Granulocytes Absolute: 0.05 10*3/uL (ref 0.00–0.07)
Immature Granulocytes: 0.4 %
Lymphocytes Absolute Automated: 3.44 10*3/uL — ABNORMAL HIGH (ref 0.42–3.22)
Lymphocytes Automated: 25.9 %
MCH: 30.2 pg (ref 25.1–33.5)
MCHC: 31.9 g/dL (ref 31.5–35.8)
MCV: 94.5 fL (ref 78.0–96.0)
MPV: 10.1 fL (ref 8.9–12.5)
Monocytes Absolute Automated: 0.32 10*3/uL (ref 0.21–0.85)
Monocytes: 2.4 %
Neutrophils Absolute: 9.03 10*3/uL — ABNORMAL HIGH (ref 1.10–6.33)
Neutrophils: 68.2 %
Nucleated RBC: 0 /100 WBC (ref 0.0–0.0)
Platelets: 338 10*3/uL (ref 142–346)
RBC: 4.01 10*6/uL (ref 3.90–5.10)
RDW: 12 % (ref 11–15)
WBC: 13.26 10*3/uL — ABNORMAL HIGH (ref 3.10–9.50)

## 2021-06-28 LAB — COMPREHENSIVE METABOLIC PANEL
ALT: 17 U/L (ref 0–55)
AST (SGOT): 16 U/L (ref 5–41)
Albumin/Globulin Ratio: 1.2 (ref 0.9–2.2)
Albumin: 4 g/dL (ref 3.5–5.0)
Alkaline Phosphatase: 76 U/L (ref 37–117)
Anion Gap: 8 (ref 5.0–15.0)
BUN: 10 mg/dL (ref 7.0–21.0)
Bilirubin, Total: 0.6 mg/dL (ref 0.2–1.2)
CO2: 25 mEq/L (ref 17–29)
Calcium: 9.7 mg/dL (ref 8.5–10.5)
Chloride: 104 mEq/L (ref 99–111)
Creatinine: 0.7 mg/dL (ref 0.4–1.0)
Globulin: 3.3 g/dL (ref 2.0–3.6)
Glucose: 101 mg/dL — ABNORMAL HIGH (ref 70–100)
Potassium: 4 mEq/L (ref 3.5–5.3)
Protein, Total: 7.3 g/dL (ref 6.0–8.3)
Sodium: 137 mEq/L (ref 135–145)

## 2021-06-28 LAB — GFR: EGFR: >60

## 2021-06-28 LAB — HEMOLYSIS INDEX: Hemolysis Index: 8 Index (ref 0–24)

## 2021-06-28 NOTE — Progress Notes (Signed)
Subjective:      Patient ID: Virginia Navarro is a 20 y.o. female.    Chief Complaint:  Chief Complaint   Patient presents with    Diarrhea     HPI:  Pt presents c/o GI issues over the past two months.  Had COVID a few months ago. Lost about 15 lbs she had previously gained  Had dental surgery and had a severe reaction to Clindamycin given (vomiting).  Pt developed diarrhea afterwards.  Pt reports diarrhea has resolved, but is concerned with weight loss.  Pt was at [facility] on 06/01/21 and received IV fluids. She was tested for mono, COVID.  Diarrhea stopped last Thursday.  Still with stomach pain which is worse at night.  Stool had a lot of mucous but no blood. Was going every 5 to 60 minutes.  Now 2 to 4 times a day. Still loose.  Has been following BRAT diet.  Pt hs been underweight chronically but never tested for Celiac disease.  She has 3 dogs at home.        Problem List:  There is no problem list on file for this patient.      Current Medications:  Current Outpatient Medications   Medication Sig Dispense Refill    Estarylla 0.25-35 MG-MCG per tablet        No current facility-administered medications for this visit.       Allergies:  Allergies   Allergen Reactions    Amoxicillin Hives    Clindamycin Nausea And Vomiting     Given around dental surgery.    Penicillins Hives       Past Medical History:  History reviewed. No pertinent past medical history.    Past Surgical History:  Past Surgical History:   Procedure Laterality Date    DENTAL SURGERY  05/25/2021       Family History:  History reviewed. No pertinent family history.    Social History:  Social History     Socioeconomic History    Marital status: Single   Tobacco Use    Smoking status: Never    Smokeless tobacco: Never   Vaping Use    Vaping Use: Never used   Substance and Sexual Activity    Alcohol use: Never    Drug use: Never    Sexual activity: Not Currently       The following portions of the patient's history were reviewed and updated as  appropriate: allergies, current medications, past family history, past medical history, past social history, past surgical history and problem list.    ROS:  Review of Systems  Per HPI    Vitals:  BP 106/73 (BP Site: Left arm, Patient Position: Sitting, Cuff Size: Small)   Pulse 89   Temp 98.4 F (36.9 C) (Tympanic)   Wt 46.1 kg (101 lb 9.6 oz)   SpO2 99%   BMI 15.91 kg/m   Objective:   Physical Exam:  Physical Exam  Constitutional:       Appearance: Normal appearance. She is underweight.   HENT:      Head: Normocephalic and atraumatic.   Pulmonary:      Effort: Pulmonary effort is normal.   Abdominal:      General: Abdomen is flat. Bowel sounds are increased.      Palpations: Abdomen is soft.      Tenderness: There is generalized abdominal tenderness. There is no guarding or rebound.   Skin:     General: Skin is warm and  dry.      Findings: No rash.   Neurological:      General: No focal deficit present.      Mental Status: She is alert and oriented to person, place, and time.   Psychiatric:         Mood and Affect: Mood normal.         Behavior: Behavior normal.          Assessment/   1. Chronic diarrhea  - Celiac Disease Comprehensive Panel  - Comprehensive metabolic panel  - CBC and differential      Plan:   Checking labs.  Ordered stool studies.  Continue BRAT diet.  Avoid milk protein for the next 2 weeks.  Start a daily probiotic.    Levy Sjogren, NP

## 2021-06-29 ENCOUNTER — Other Ambulatory Visit (INDEPENDENT_AMBULATORY_CARE_PROVIDER_SITE_OTHER): Payer: Self-pay

## 2021-06-29 DIAGNOSIS — K529 Noninfective gastroenteritis and colitis, unspecified: Secondary | ICD-10-CM

## 2021-06-29 LAB — CELIAC DISEASE COMP PANEL: IGA for Celiac: 205 mg/dL (ref 61–356)

## 2021-06-29 LAB — TISSUE TRANSGLUTAMINASE, IGA: Tissue Transglutaminase Antibody, IgA: <1.2 U/mL

## 2021-06-30 ENCOUNTER — Other Ambulatory Visit (FREE_STANDING_LABORATORY_FACILITY): Payer: BLUE CROSS/BLUE SHIELD

## 2021-06-30 DIAGNOSIS — K529 Noninfective gastroenteritis and colitis, unspecified: Secondary | ICD-10-CM

## 2021-06-30 LAB — STOOL FOR WBC
Eosinophils Wright Stain: NONE SEEN
Lymphocytes Wright Stain: NONE SEEN
Neutrophils Wright Stain: NONE SEEN
RBC Wright Stain: NONE SEEN

## 2021-07-08 LAB — STOOL OVA AND PARASITE SMEAR

## 2021-12-20 ENCOUNTER — Other Ambulatory Visit (INDEPENDENT_AMBULATORY_CARE_PROVIDER_SITE_OTHER): Payer: Self-pay

## 2021-12-20 ENCOUNTER — Encounter (INDEPENDENT_AMBULATORY_CARE_PROVIDER_SITE_OTHER): Payer: Self-pay | Admitting: Family Medicine

## 2021-12-20 ENCOUNTER — Ambulatory Visit (INDEPENDENT_AMBULATORY_CARE_PROVIDER_SITE_OTHER): Payer: BLUE CROSS/BLUE SHIELD | Admitting: Family Medicine

## 2021-12-20 VITALS — BP 104/66 | HR 63 | Temp 97.6°F | Resp 18

## 2021-12-20 DIAGNOSIS — M25561 Pain in right knee: Secondary | ICD-10-CM

## 2021-12-20 NOTE — Progress Notes (Signed)
Subjective:      Patient ID: Virginia Navarro is a 21 y.o. female.    Chief Complaint:  Chief Complaint   Patient presents with    Knee Pain       HPI:  Right knee pain -- started in March.  Worse with walking for long distances.  Now hurts when bearing weight for long periods of time.  No hx injury/trauma.          Problem List:  There is no problem list on file for this patient.      Current Medications:  Current Outpatient Medications   Medication Sig Dispense Refill    Estarylla 0.25-35 MG-MCG per tablet        No current facility-administered medications for this visit.       Allergies:  Allergies   Allergen Reactions    Amoxicillin Hives    Clindamycin Nausea And Vomiting     Given around dental surgery.    Penicillins Hives       Past Medical History:  History reviewed. No pertinent past medical history.    Past Surgical History:  Past Surgical History:   Procedure Laterality Date    DENTAL SURGERY  05/25/2021       Family History:  History reviewed. No pertinent family history.    Social History:  Social History     Socioeconomic History    Marital status: Single   Tobacco Use    Smoking status: Never    Smokeless tobacco: Never   Vaping Use    Vaping status: Never Used   Substance and Sexual Activity    Alcohol use: Never    Drug use: Never    Sexual activity: Not Currently        The following sections were reviewed this encounter by the provider:   Tobacco  Allergies  Meds  Problems  Med Hx  Surg Hx  Fam Hx         ROS:  Review of Systems   Musculoskeletal:  Positive for arthralgias. Negative for gait problem and joint swelling.       Vitals:  BP 104/66 (BP Site: Right arm, Patient Position: Sitting, Cuff Size: Small)   Pulse 63   Temp 97.6 F (36.4 C) (Tympanic)   Resp 18      Objective:     Physical Exam:  Physical Exam  Vitals and nursing note reviewed.   Constitutional:       General: She is not in acute distress.     Appearance: Normal appearance.   HENT:      Head: Normocephalic.    Eyes:      Extraocular Movements: Extraocular movements intact.      Pupils: Pupils are equal, round, and reactive to light.   Pulmonary:      Effort: Pulmonary effort is normal. No respiratory distress.   Musculoskeletal:      Cervical back: Normal range of motion.      Right knee: No swelling, effusion or crepitus. Normal range of motion. Tenderness (distal patellar) present. No LCL laxity, MCL laxity, ACL laxity or PCL laxity. Normal alignment, normal meniscus and normal patellar mobility.   Skin:     General: Skin is warm.      Capillary Refill: Capillary refill takes less than 2 seconds.   Neurological:      General: No focal deficit present.      Mental Status: She is alert and oriented to person, place, and time. Mental  status is at baseline.      Gait: Gait normal.   Psychiatric:         Mood and Affect: Mood normal.         Behavior: Behavior normal.         Thought Content: Thought content normal.         Judgment: Judgment normal.          Assessment:     1. Acute pain of right knee  - XR Knee Right 4+ Views; Future  - VNA [place] LOWER EXTREMITY; Future    ANTERIOR KNEE MSK ULTRASONOGRAPHY    Patient Name:  Virginia Navarro  Date of Exam:  December 20, 2021  Time of Exam:  9:11 AM  Ordering Provider: Oralia Rud, MD    Indications for study:  right knee pain      LIMITED musculoskeletal ultrasound examination was performed on the right anterior knee with the following findings:     Quadriceps tendon - long:                   normal echogenic, linearly compact fibrillar appearance and tendon thickening.    Quadriceps tendon - trans:                  normal echogenic, tessellate compact fibrillar appearance and tendon thickening.    Suprapatellar recess - long:                 normal appearance with no evidence of cyst or ganglion.    Suprapatellar fat pads:                         normal appearing fat pad(s).    Patellar tendon - long:                          normal echogenic, linearly compact  fibrillar appearance.    Patellar tendon - trans:                         normal echogenic, tessellate compact fibrillar appearance.    Hoffa's fat pad:                                     normal appearing fat pad(s).      Impression:    Mild thickening of distal quadriceps tendon      Scanned and Interpreted by:   Oralia Rud, MD  Sports Medicine  [facility]    Plan:       Patient being seen today for acute onset right knee pain, without a mechanism of injury.  Patient participates in yoga, but does not participate in high impact or rotational sports.  On physical exam, she has tenderness to palpation along the distal aspect of the quadriceps tendon, but no joint line tenderness.  Ultrasound evaluation shows a mild thickening of the distal quadriceps tendon without erythema or increased Doppler.  Provided home exercises for patellofemoral exercises  Ordered x-ray for better evaluation of any bony abnormality    Oralia Rud, MD

## 2022-01-04 ENCOUNTER — Telehealth (INDEPENDENT_AMBULATORY_CARE_PROVIDER_SITE_OTHER): Payer: Self-pay | Admitting: Family Medicine

## 2022-01-04 DIAGNOSIS — M25561 Pain in right knee: Secondary | ICD-10-CM

## 2022-01-04 NOTE — Telephone Encounter (Signed)
signed

## 2022-01-04 NOTE — Telephone Encounter (Signed)
Pt is at [facility] right now to get an X-ray. The order was signed by Dr Charlesetta Shanks and needs to be signed by another provider and faxed to [facility] at [phone] and phone# [phone].

## 2022-01-04 NOTE — Telephone Encounter (Signed)
Faxed as requested

## 2022-01-04 NOTE — Telephone Encounter (Signed)
Can you please sign pended Virginia Navarro order

## 2022-12-05 ENCOUNTER — Encounter (INDEPENDENT_AMBULATORY_CARE_PROVIDER_SITE_OTHER): Payer: Self-pay | Admitting: Family

## 2022-12-05 NOTE — Progress Notes (Signed)
Form placed in folder

## 2022-12-05 NOTE — Telephone Encounter (Signed)
Please complete and return to pt. Already signed.

## 2022-12-06 ENCOUNTER — Encounter (INDEPENDENT_AMBULATORY_CARE_PROVIDER_SITE_OTHER): Payer: Self-pay
